# Patient Record
Sex: Male | Born: 2000 | Race: Black or African American | Hispanic: No | Marital: Single | Smoking: Never smoker
Health system: Southern US, Community
[De-identification: ages and names within clinical notes are randomized; demographics above are authoritative.]

## PROBLEM LIST (undated history)

## (undated) ENCOUNTER — Ambulatory Visit (HOSPITAL_COMMUNITY): Disposition: A | Discharge status: Home / Self Care | Payer: Self-pay

---
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
## 2019-08-25 ENCOUNTER — Other Ambulatory Visit (HOSPITAL_COMMUNITY): Payer: Self-pay | Admitting: Family Medicine

## 2020-04-21 IMAGING — US US SCROTUM W/ DOPPLER COMPLETE
1 series · 13 of 25 positions shown · non-contrast
Comparison: None.

CLINICAL DATA: Left scrotal region pain

EXAM:
SCROTAL ULTRASOUND
DOPPLER ULTRASOUND OF THE TESTICLES
TECHNIQUE: Complete ultrasound examination of the testicles, epididymis, and
other scrotal structures was performed. Color and spectral Doppler
ultrasound were also utilized to evaluate blood flow to the
testicles.

[Series 1: us scrotum w/ doppler complete · 0.07mm/px · 13 of 77 slices shown]
[im 1/77]
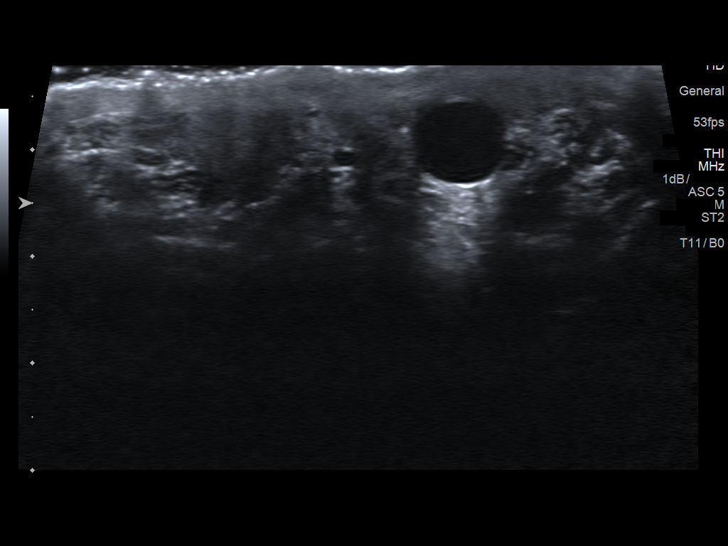
[im 7/77]
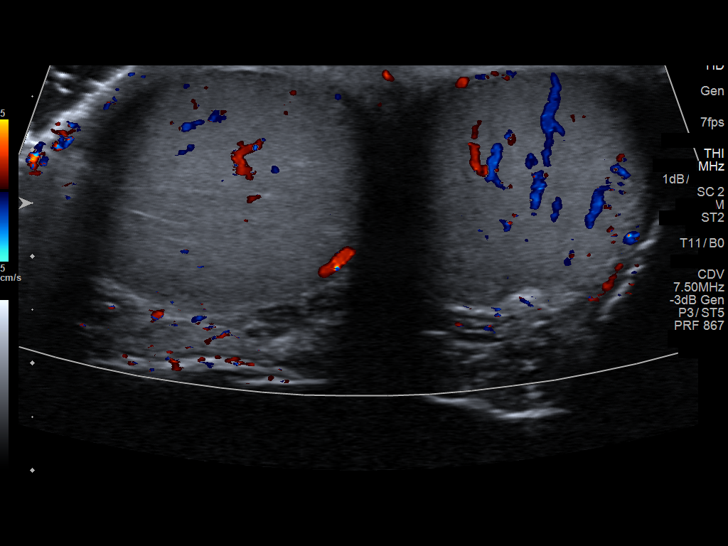
[im 13/77]
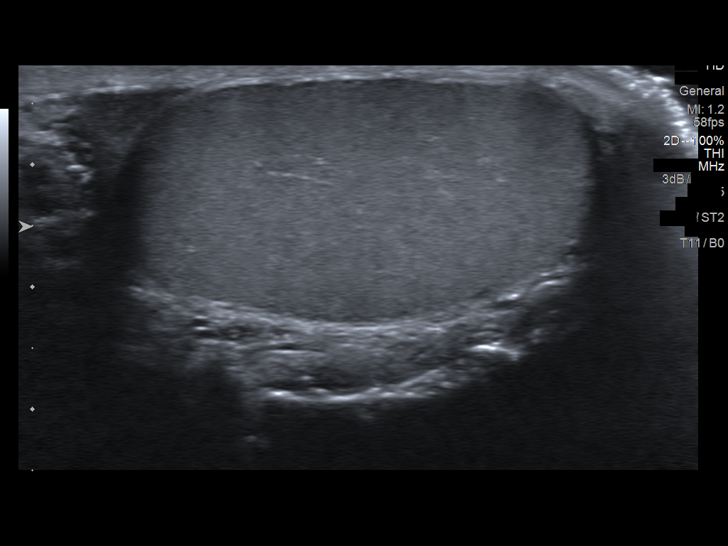
[im 20/77]
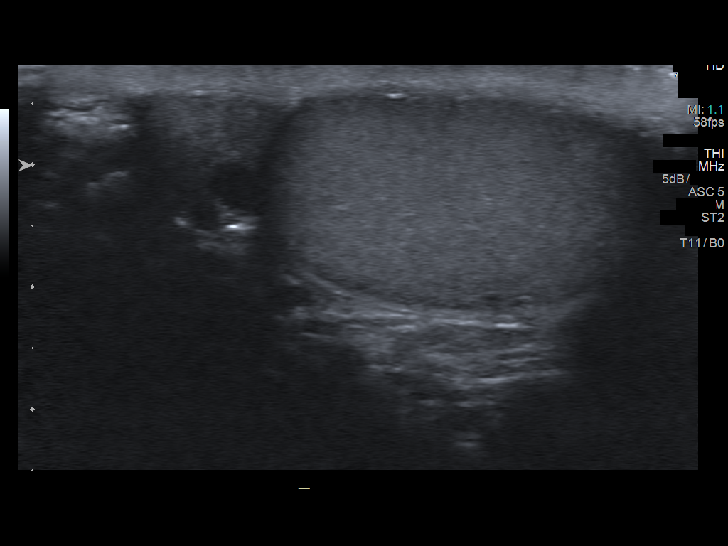
[im 26/77]
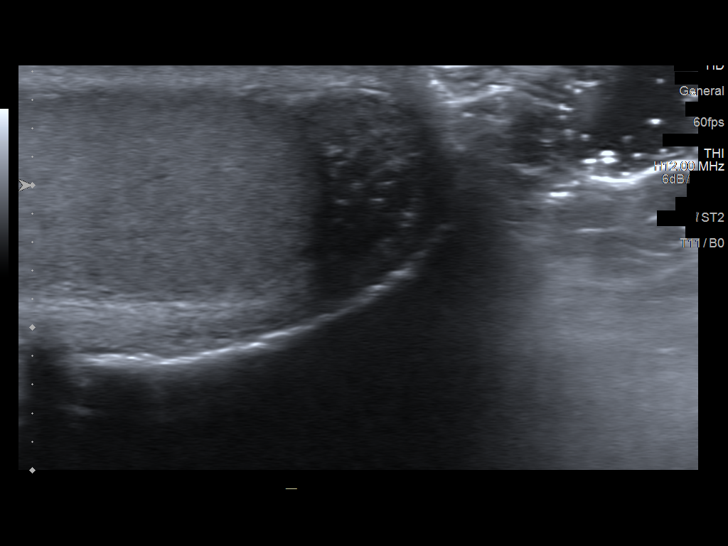
[im 32/77]
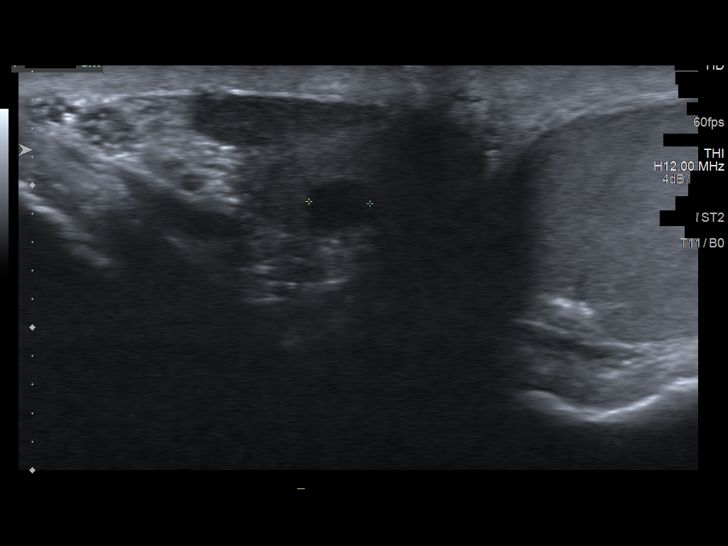
[im 39/77]
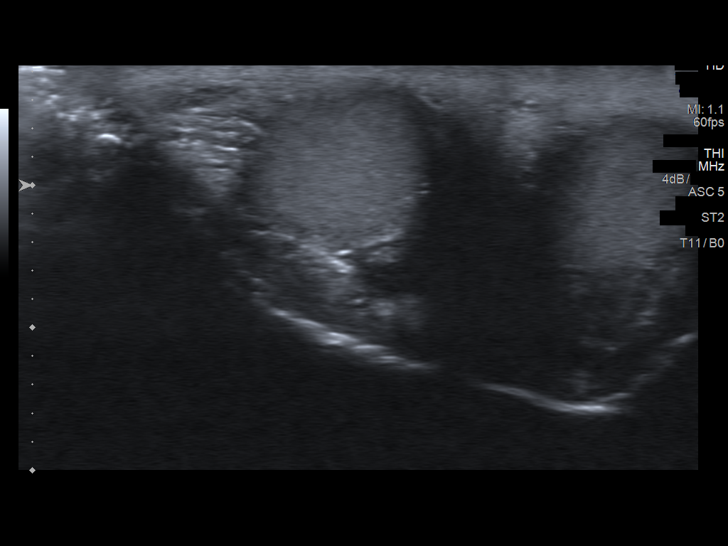
[im 45/77]
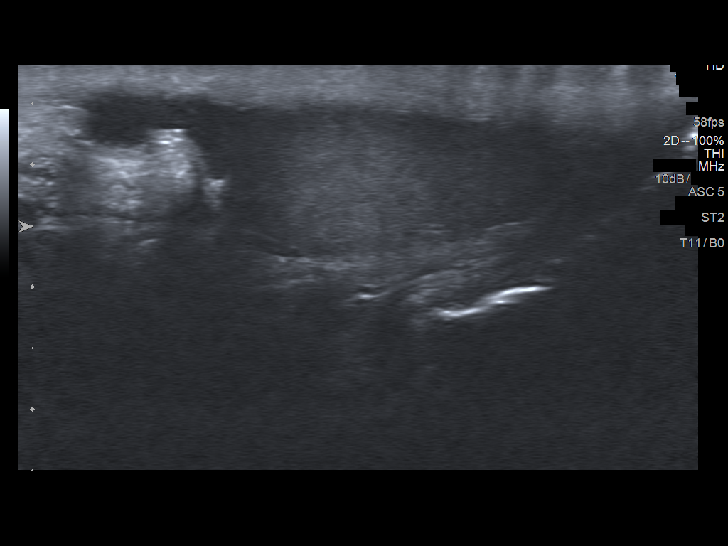
[im 51/77]
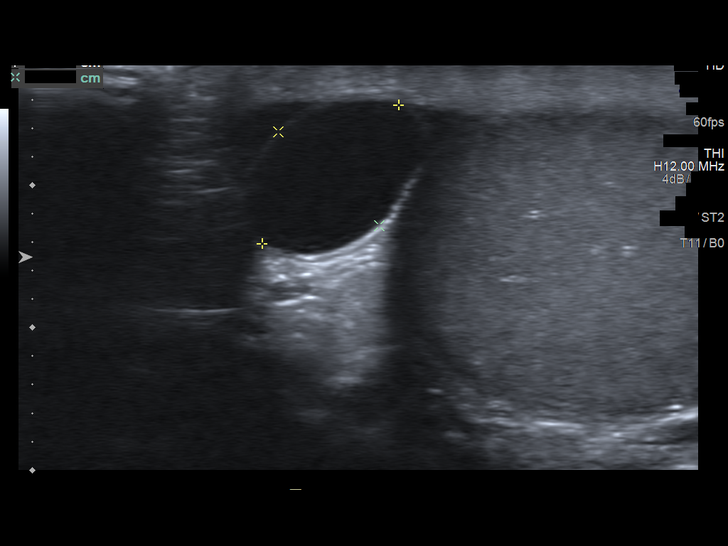
[im 58/77]
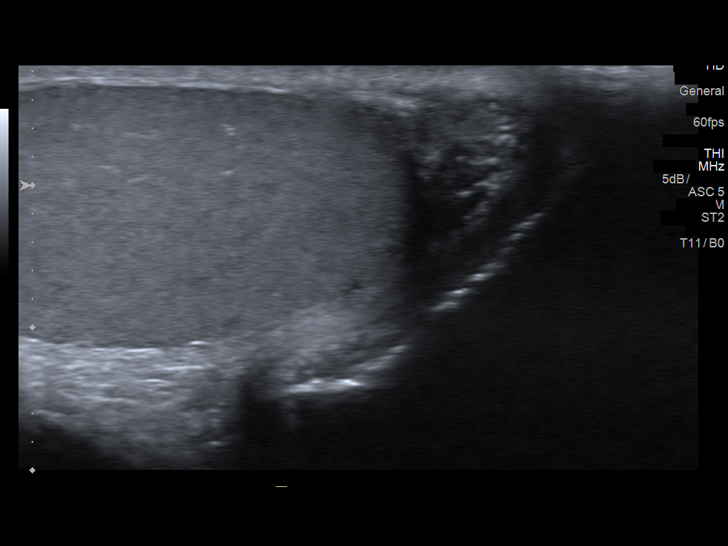
[im 64/77]
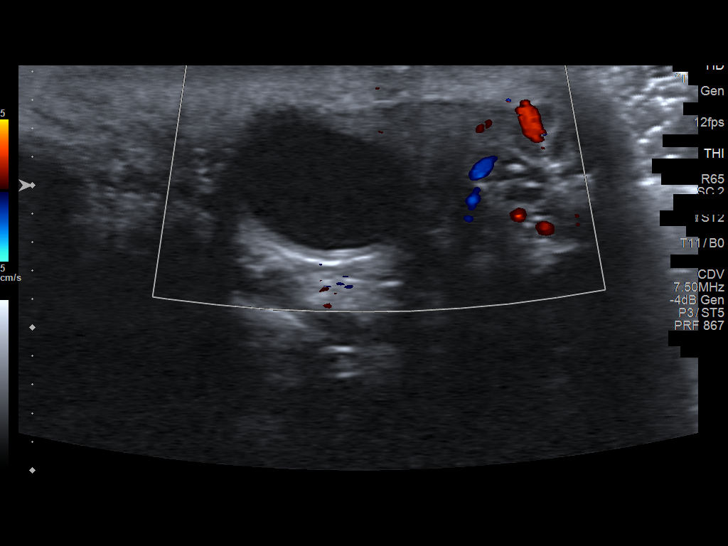
[im 70/77]
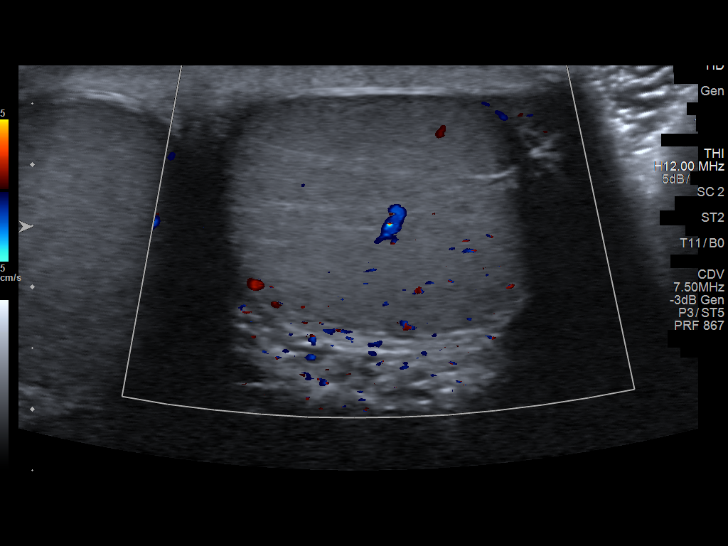
[im 77/77]
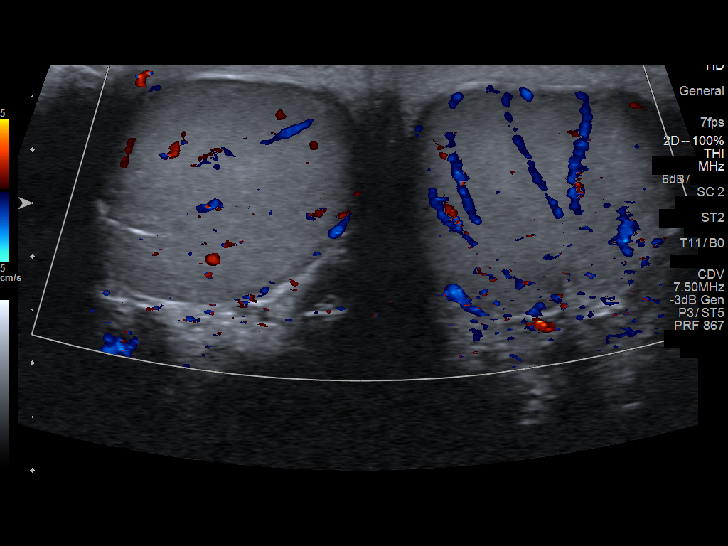

[13 of 25 positions shown; findings below may reference images not displayed]

FINDINGS: Right testicle

Measurements: 4.0 x 2.0 x 2.6 cm. No mass or microlithiasis
visualized.

Left testicle

Measurements: 4.1 x 1.9 x 2.6 cm. No mass or microlithiasis
visualized.

Right epididymis: There is a 4 mm cyst arising from the head of the
epididymis on the right. No inflammatory focus or hypervascularity
in the right epididymal region.

Left epididymis: There is a cyst arising from the head of the
epididymis on the left measuring 1.4 x 1.0 x 2.0 cm. No
hypervascularity or inflammatory focus evident.

Hydrocele: There is a minimal hydrocele on the left. There is no
appreciable hydrocele on the right.

Varicocele:  None visualized.

Pulsed Doppler interrogation of both testes demonstrates normal low
resistance arterial and venous waveforms bilaterally.

No evidence scrotal wall thickening or scrotal abscess.
IMPRESSION: 1. Epididymal head cysts, significantly larger on the left than on
the right. Epididymal head cyst on the left measures 1.4 x 1.0 x
cm.

2.  Minimal left hydrocele.

3. No evidence of orchitis or epididymitis. No intratesticular mass
on either side. No evident testicular torsion on either side.

## 2020-04-22 IMAGING — US US SCROTUM W/ DOPPLER COMPLETE
1 series · 14 of 25 positions shown · non-contrast
Comparison: None.

CLINICAL DATA: Pain and mass associated with right testis.

EXAM:
SCROTAL ULTRASOUND
DOPPLER ULTRASOUND OF THE TESTICLES
TECHNIQUE: Complete ultrasound examination of the testicles, epididymis, and
other scrotal structures was performed. Color and spectral Doppler
ultrasound were also utilized to evaluate blood flow to the
testicles.

[Series 1: us scrotum w/ doppler complete · 14 of 64 slices shown]
[im 1/64]
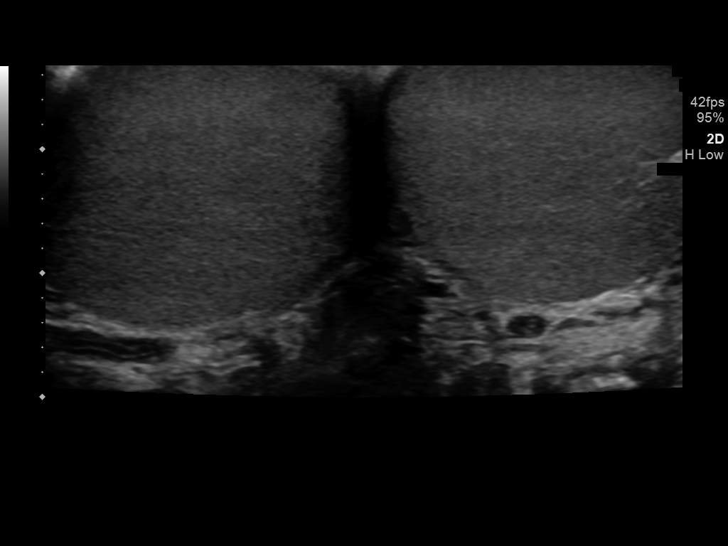
[im 6/64]
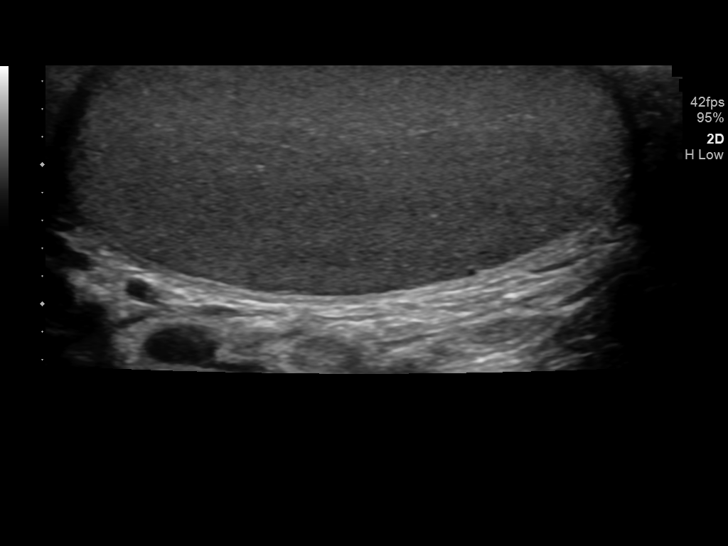
[im 11/64]
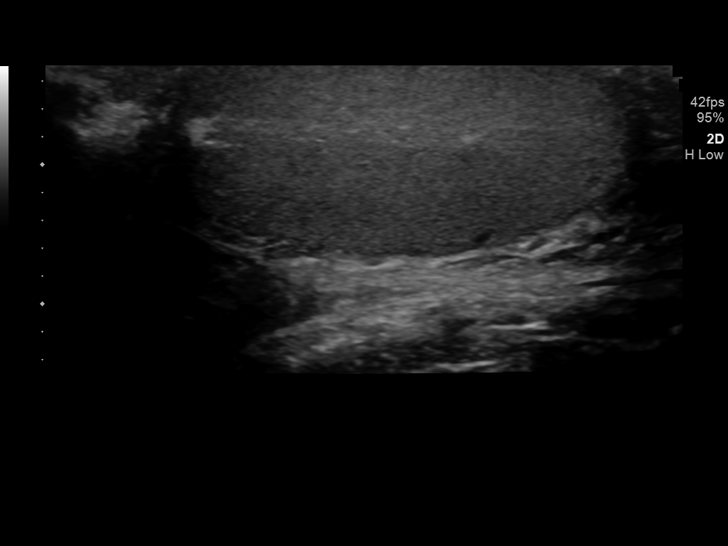
[im 16/64]
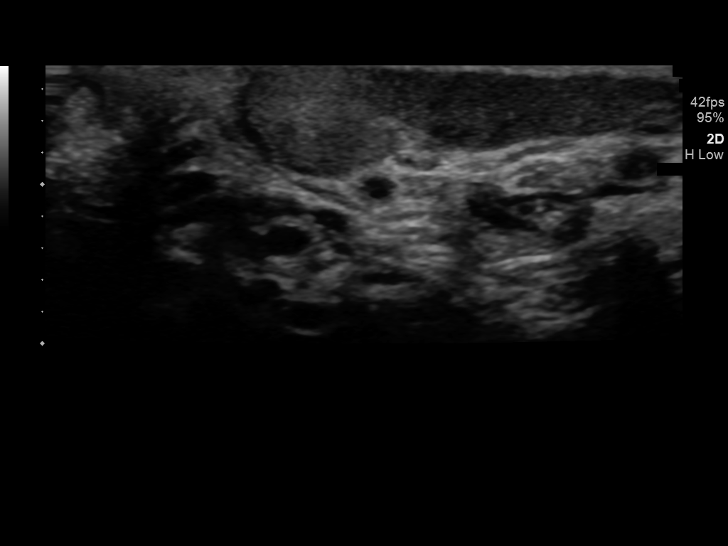
[im 22/64]
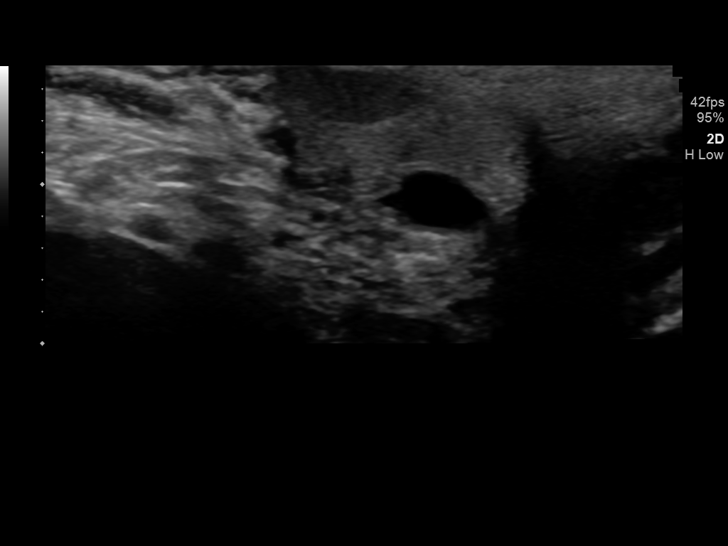
[im 24/64]
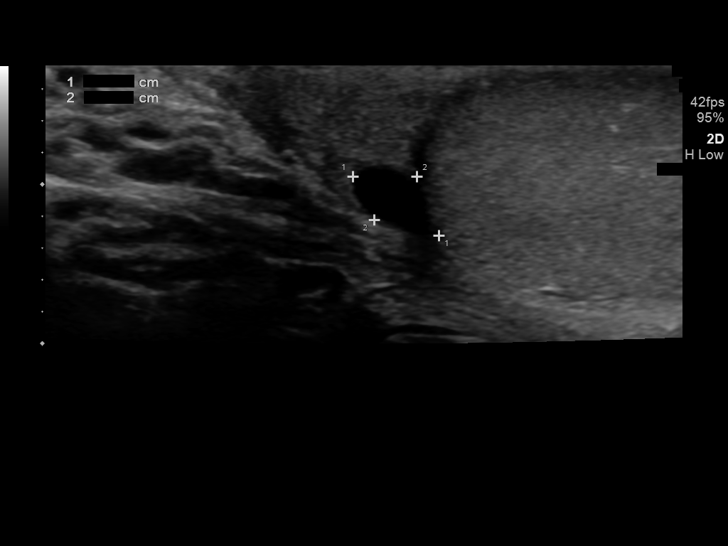
[im 29/64]
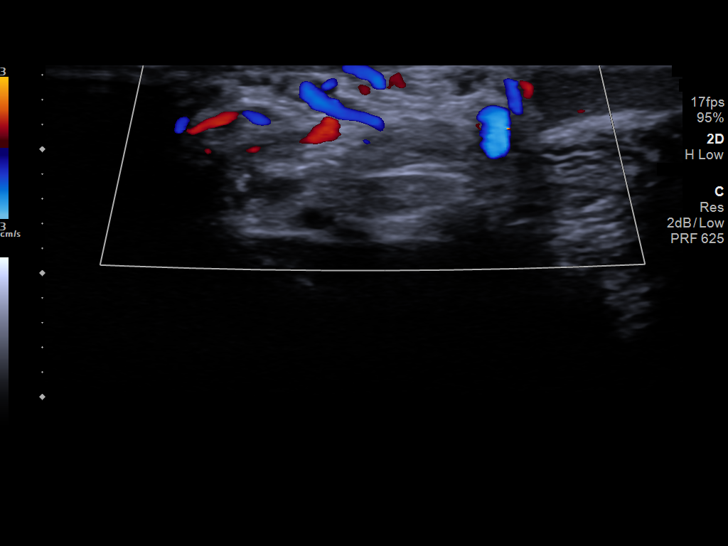
[im 35/64]
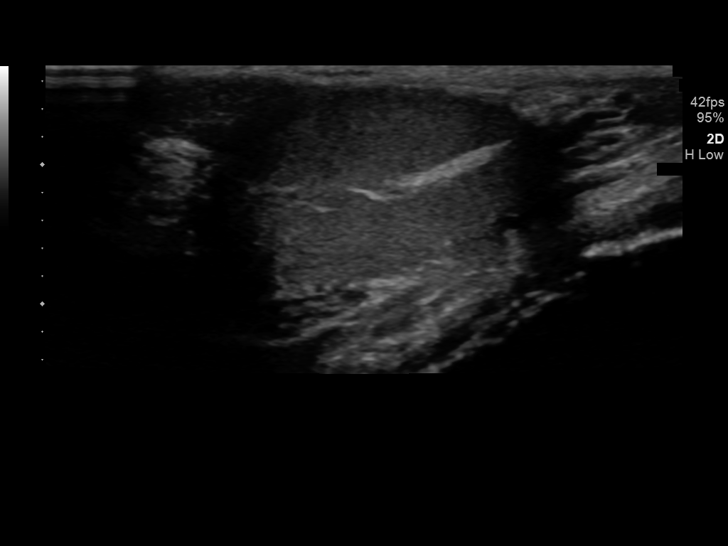
[im 40/64]
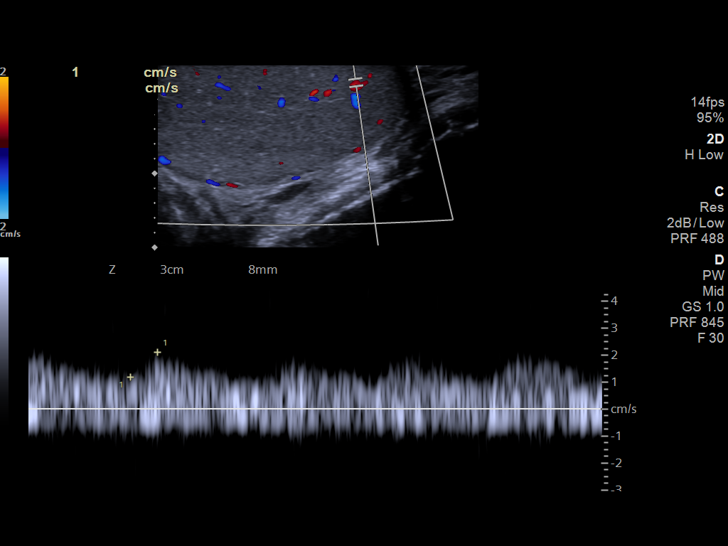
[im 43/64]
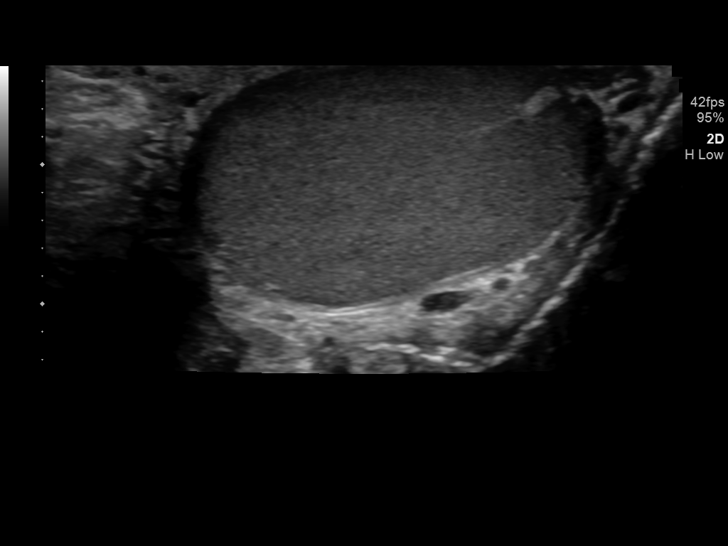
[im 48/64]
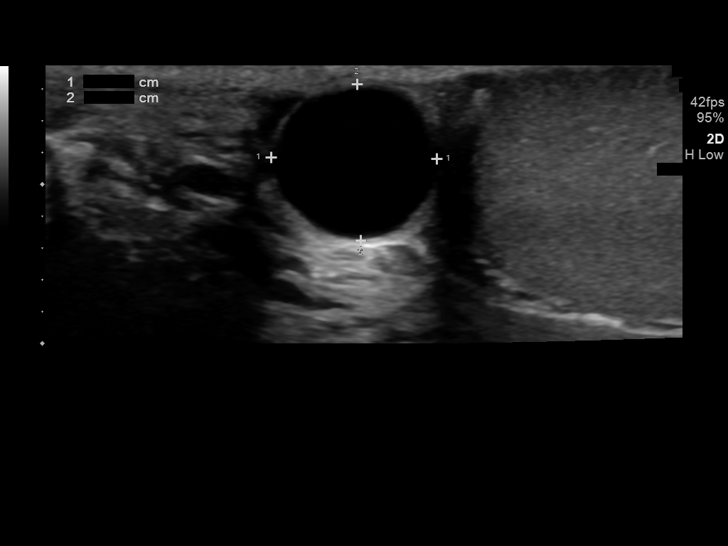
[im 53/64]
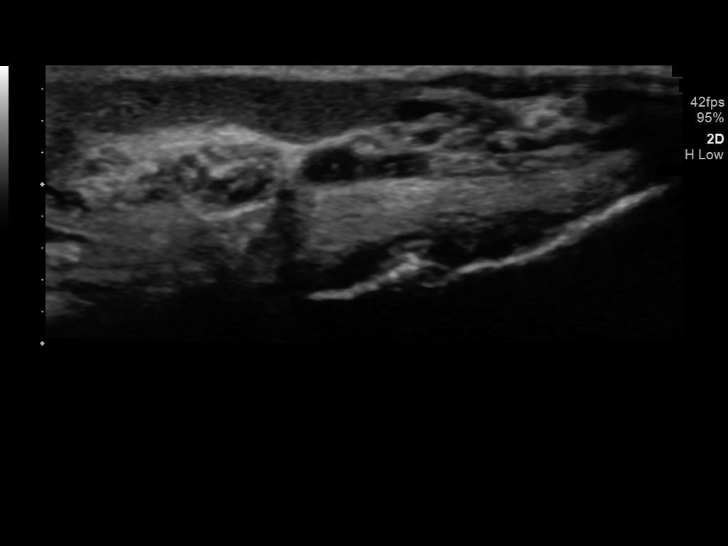
[im 58/64]
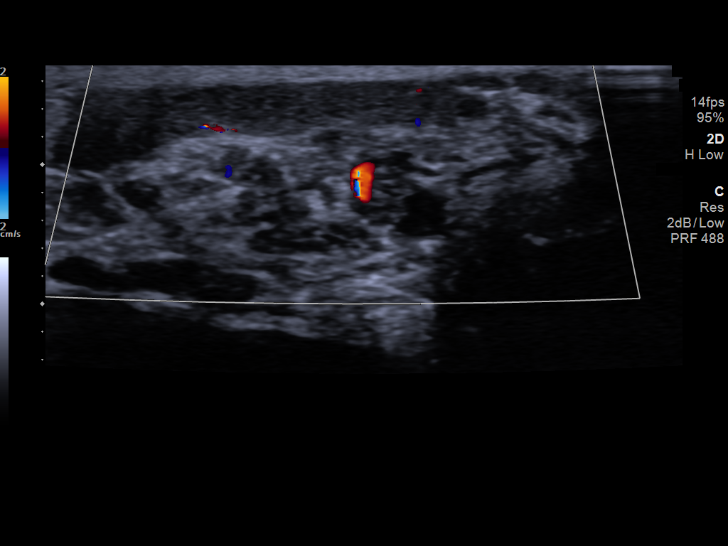
[im 64/64]
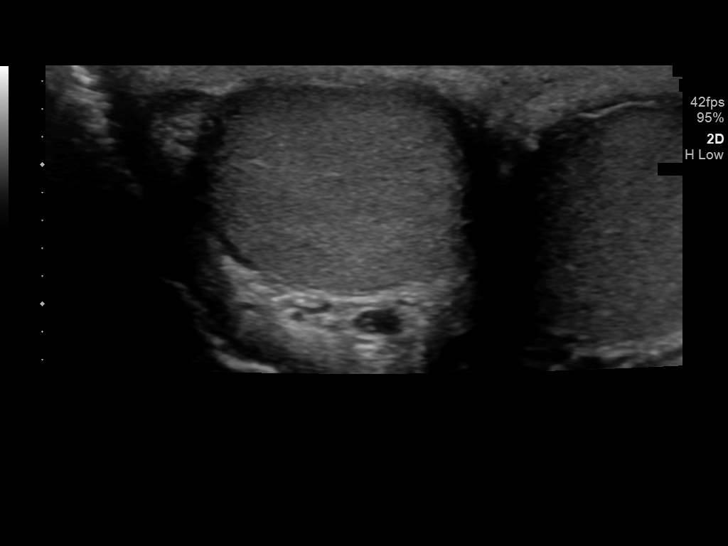

[14 of 25 positions shown; findings below may reference images not displayed]

FINDINGS: Right testicle

Measurements: 4.0 x 1.7 x 2.8 cm. No mass or microlithiasis
visualized.

Left testicle

Measurements: 3.8 x 2.0 x 2.7 cm. No mass or microlithiasis
visualized.

Right epididymis: 7 x 7 x 4 mm cyst identified within the
epididymis.

Left epididymis:  Cyst in left epididymis measures 10 x 10 x 10 mm.

Hydrocele:  None visualized.

Varicocele:  None visualized.

Pulsed Doppler interrogation of both testes demonstrates normal low
resistance arterial and venous waveforms bilaterally.
IMPRESSION: 1. No evidence for testicular mass or torsion.
2. Bilateral epididymal cysts measuring up to 1 cm.

## 2020-05-27 ENCOUNTER — Other Ambulatory Visit: Payer: Self-pay

## 2020-05-27 ENCOUNTER — Emergency Department (HOSPITAL_BASED_OUTPATIENT_CLINIC_OR_DEPARTMENT_OTHER)
Admission: EM | Admit: 2020-05-27 | Discharge: 2020-05-28 | Disposition: A | Discharge status: Home / Self Care | Payer: Self-pay | Attending: Emergency Medicine | Admitting: Emergency Medicine

## 2020-05-27 ENCOUNTER — Encounter (HOSPITAL_BASED_OUTPATIENT_CLINIC_OR_DEPARTMENT_OTHER): Payer: Self-pay | Admitting: *Deleted

## 2020-05-27 DIAGNOSIS — H6123 Impacted cerumen, bilateral: Secondary | ICD-10-CM | POA: Insufficient documentation

## 2020-05-27 DIAGNOSIS — K0889 Other specified disorders of teeth and supporting structures: Secondary | ICD-10-CM | POA: Insufficient documentation

## 2020-05-27 NOTE — ED Triage Notes (Signed)
Left ear pain and fullness for a week. The pain makes his teeth hurt.

## 2020-05-28 NOTE — ED Provider Notes (Signed)
[facility] EMERGENCY DEPARTMENT Provider Note   CSN: [id number] Arrival date & time: 05/27/20  2127   History Chief Complaint  Patient presents with  . Otalgia    Ronnie Glass is a 19 y.o. male.  The history is provided by the patient.  Otalgia He complains of his left ear being clogged for the last week.  That sensation has gotten worse and he is now having some sharp pains in that ear.  He denies any difficulty hearing.  He denies any fever or chills.  She has also noted some pain continues to be coming from a tooth on the left side.  He has not done anything to treat his symptoms.  History reviewed. No pertinent past medical history.  There are no problems to display for this patient.   History reviewed. No pertinent surgical history.     No family history on file.  Social History   Tobacco Use  . Smoking status: Never Smoker  . Smokeless tobacco: Never Used  Substance Use Topics  . Alcohol use: Not Currently  . Drug use: Yes    Types: Marijuana    Home Medications Prior to Admission medications   Medication Sig Start Date End Date Taking? Authorizing Provider  meloxicam (MOBIC) 7.5 MG tablet Take 7.5 mg by mouth daily.    [provider]    Allergies    Patient has no known allergies.  Review of Systems   Review of Systems  HENT: Positive for ear pain.   All other systems reviewed and are negative.   Physical Exam Updated Vital Signs BP 124/84 (BP Location: Right Arm)   Pulse 70   Temp 97.9 F (36.6 C) (Oral)   Resp 18   Ht 5\' 6"  (1.676 m)   Wt 63.4 kg   SpO2 100%   BMI 22.55 kg/m   Physical Exam Vitals and nursing note reviewed.   19 year old male, resting comfortably and in no acute distress. Vital signs are normal. Oxygen saturation is 100%, which is normal. Head is normocephalic and atraumatic. PERRLA, EOMI. Oropharynx is clear.  Both tympanic membranes are obscured by cerumen.  Dentition appears normal. Neck is  nontender and supple without adenopathy or JVD. Back is nontender and there is no CVA tenderness. Lungs are clear without rales, wheezes, or rhonchi. Chest is nontender. Heart has regular rate and rhythm without murmur. Abdomen is soft, flat, nontender without masses or hepatosplenomegaly and peristalsis is normoactive. Extremities have no cyanosis or edema, full range of motion is present. Skin is warm and dry without rash. Neurologic: Mental status is normal, cranial nerves are intact, there are no motor or sensory deficits.  ED Results / Procedures / Treatments   Labs ( Procedures Procedures   Medications Ordered in ED Medications - No data to display  ED Course  I have reviewed the triage vital signs and the nursing notes.  MDM Rules/Calculators/A&P Bilateral cerumen impactions.  Old records are reviewed, and he has no relevant past visits  Ears are irrigated and reevaluated.  After cerumen impaction is removed from left ear, patient feels much better.  The tympanic membrane shows no evidence of infection.  After cerumen impaction is removed from the right ear, the patient says he feels well.  He is advised use over-the-counter earwax control systems to prevent excessive earwax buildup in the future.  Final Clinical Impression(s) / ED Diagnoses Final diagnoses:  Bilateral impacted cerumen    Rx / DC Orders ED Discharge  Orders    None       Dione Booze, MD 05/28/20 9379

## 2020-05-28 NOTE — Discharge Instructions (Addendum)
Use over-the-counter ear wax control systems. Return if you are having any problems.

## 2020-06-12 IMAGING — US ULTRASOUND SCROTUM DOPPLER COMPLETE
1 series · 14 of 25 positions shown · non-contrast
Comparison: 07/08/2018

CLINICAL DATA: Testicular pain

EXAM:
SCROTAL ULTRASOUND
DOPPLER ULTRASOUND OF THE TESTICLES
TECHNIQUE: Complete ultrasound examination of the testicles, epididymis, and
other scrotal structures was performed. Color and spectral Doppler
ultrasound were also utilized to evaluate blood flow to the
testicles.

[Series 1: ultrasound scrotum doppler complete · 14 of 37 slices shown]
[im 1/37]
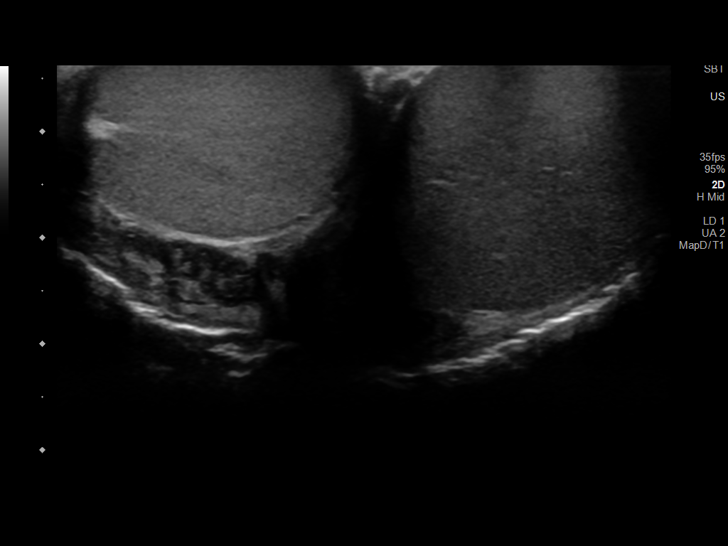
[im 4/37]
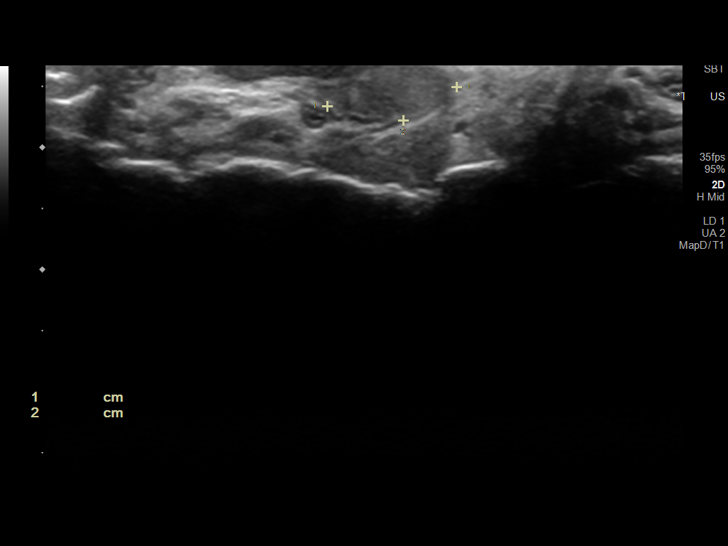
[im 7/37]
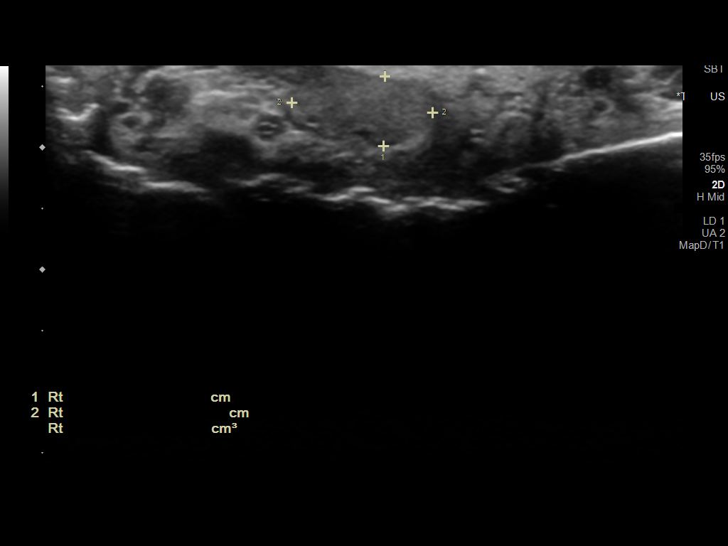
[im 10/37]
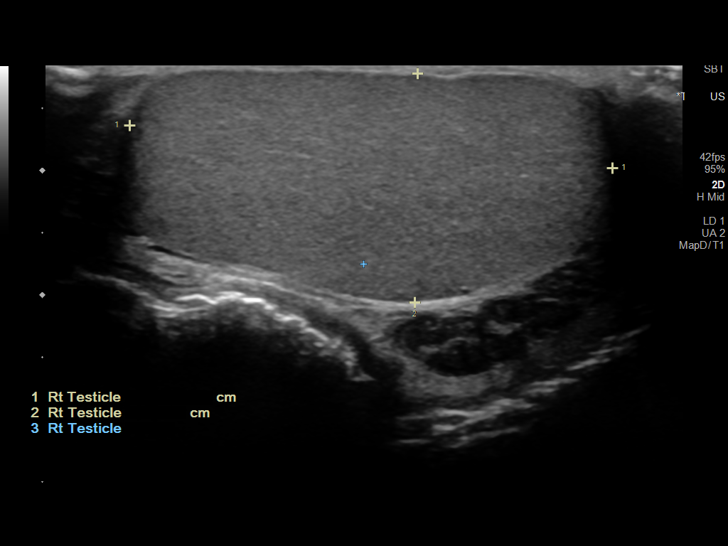
[im 13/37]
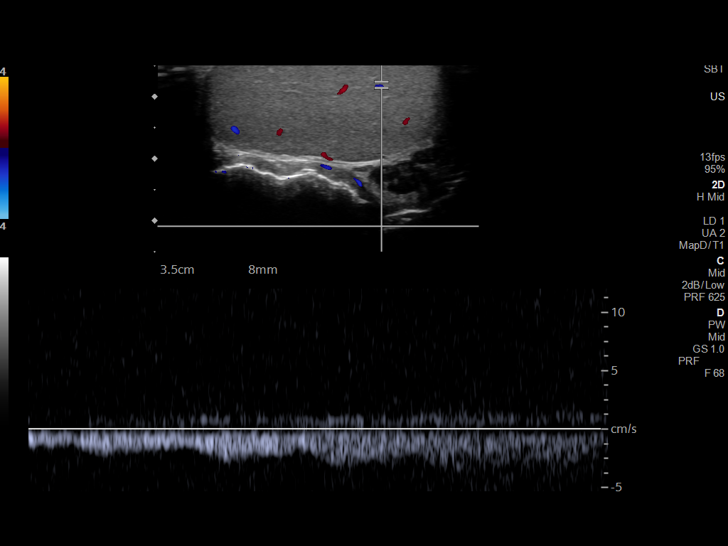
[im 14/37]
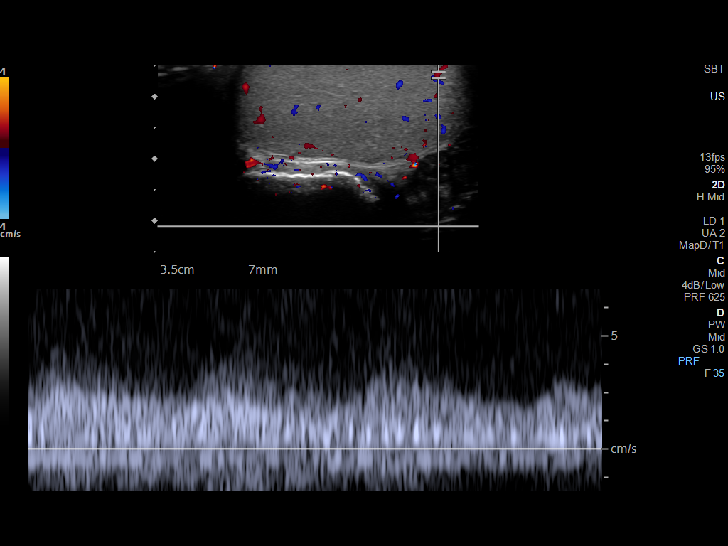
[im 17/37]
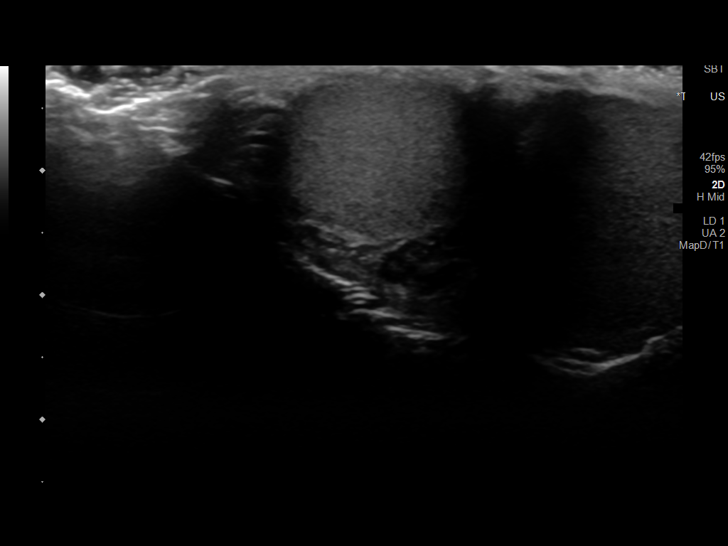
[im 20/37]
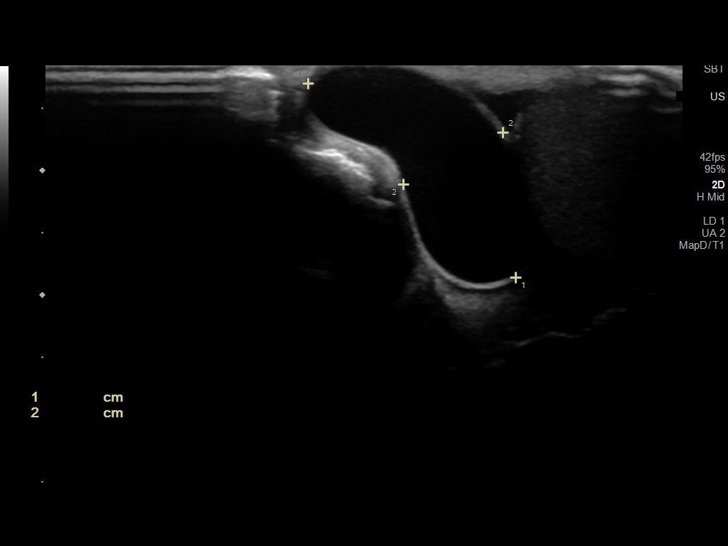
[im 23/37]
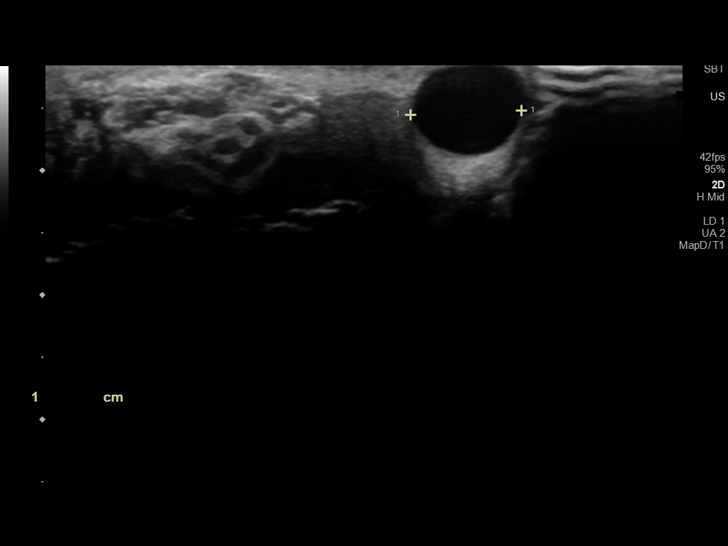
[im 25/37]
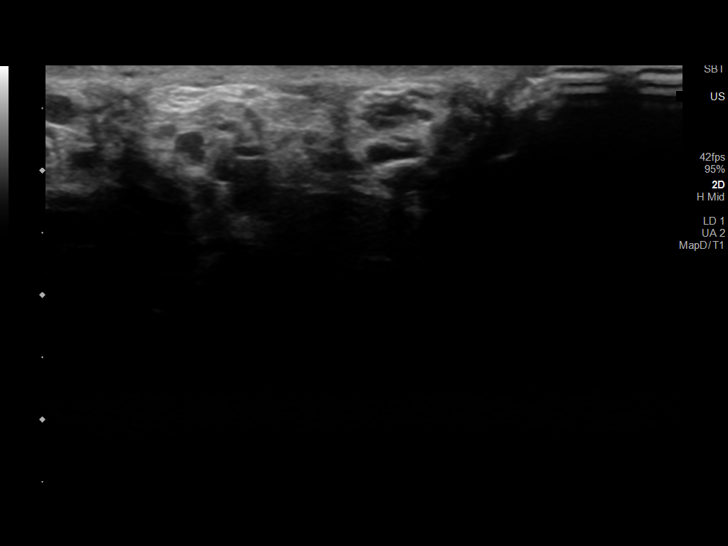
[im 28/37]
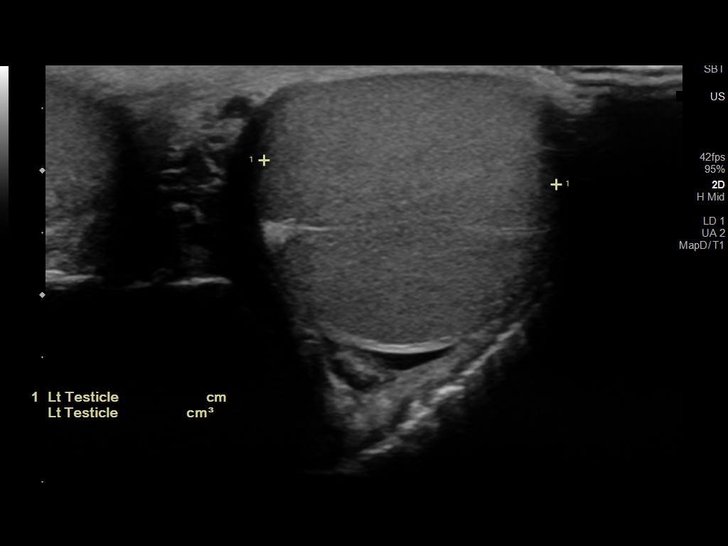
[im 31/37]
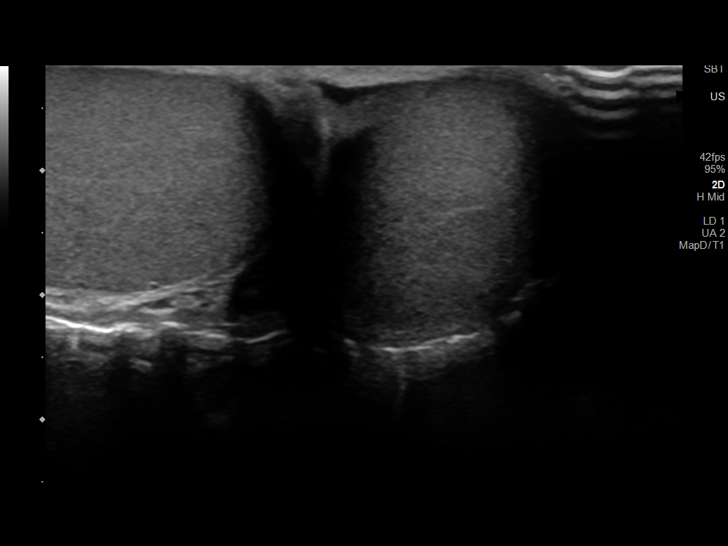
[im 34/37]
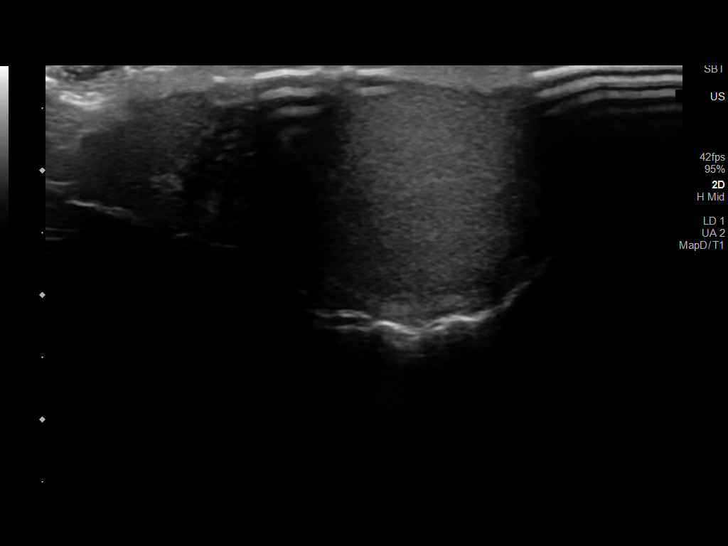
[im 37/37]
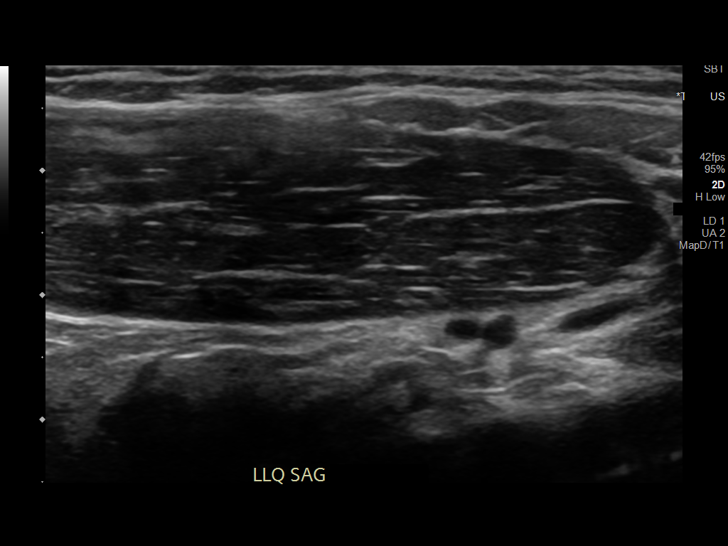

[14 of 25 positions shown; findings below may reference images not displayed]

FINDINGS: Right testicle

Measurements: 3.9 x 1.8 x 2.5 cm. No mass or microlithiasis
visualized.

Left testicle

Measurements: 3.6 x 1.9 x 2.4 cm. No mass or microlithiasis
visualized.

Right epididymis: Normal in size and appearance. 6 mm right
epididymal cyst.

Left epididymis: Normal in size and appearance. 2.3 cm left
epididymal cyst.

Hydrocele:  Small left hydrocele.

Varicocele:  None visualized.

Pulsed Doppler interrogation of both testes demonstrates normal low
resistance arterial and venous waveforms bilaterally.
IMPRESSION: 1. No testicular torsion.
2. Small left hydrocele.

## 2022-12-15 ENCOUNTER — Encounter (HOSPITAL_BASED_OUTPATIENT_CLINIC_OR_DEPARTMENT_OTHER): Payer: Self-pay

## 2022-12-15 ENCOUNTER — Emergency Department (HOSPITAL_BASED_OUTPATIENT_CLINIC_OR_DEPARTMENT_OTHER)
Admission: EM | Admit: 2022-12-15 | Discharge: 2022-12-15 | Disposition: A | Discharge status: Home / Self Care | Payer: Self-pay | Attending: Emergency Medicine | Admitting: Emergency Medicine

## 2022-12-15 DIAGNOSIS — J029 Acute pharyngitis, unspecified: Secondary | ICD-10-CM | POA: Insufficient documentation

## 2022-12-15 DIAGNOSIS — Z1152 Encounter for screening for COVID-19: Secondary | ICD-10-CM | POA: Insufficient documentation

## 2022-12-15 DIAGNOSIS — H6123 Impacted cerumen, bilateral: Secondary | ICD-10-CM | POA: Insufficient documentation

## 2022-12-15 LAB — SARS CORONAVIRUS 2 BY RT PCR: SARS Coronavirus 2 by RT PCR: NEGATIVE

## 2022-12-15 LAB — GROUP A STREP BY PCR: Group A Strep by PCR: NOT DETECTED

## 2022-12-15 NOTE — ED Triage Notes (Signed)
Patient has a sore throat for three days. He is also concerned his ears are clogged up because his hearing is not normal.

## 2022-12-15 NOTE — Discharge Instructions (Addendum)
The earwax in your ears has been cleaned out today.  Refrain from using Q-tips in your ears, but you may use them to clean the outside of your ears.   Your strep test was negative today. You may continue to drink warm tea with honey and use over the counter sore throat medications as needed to help with your sore throat. Your sore throat should improve on its own within the next week.   Return to the ER if you have uncontrolled fever, chills, difficulty breathing, difficulty swallowing, dizziness, any other symptoms that are concerning for you.

## 2022-12-15 NOTE — ED Provider Notes (Signed)
[facility] [facility] Provider Note   CSN: [id number] Arrival date & time: 12/15/22  1444     History  Chief Complaint  Patient presents with   Hearing Problem   Sore Throat    Ronnie Glass is a 22 y.o. male who presents with 3 days of sore throat when swallowing and  clogged ears.  Reports that his sore throat is only when swallowing and it has been gradually improving over the last couple days.  Denies any fevers or chills, cough, itchy eyes.  He does note a runny nose.  Denies any sick contacts.  He reports his ears have felt clogged for the past couple days.  He has tried hydrogen peroxide without improvement.  Uses Q-tips only on the outside of his ears.  Does use air pods which may have contributed to the clogging.   Sore Throat       Home Medications Prior to Admission medications   Not on File      Allergies    Patient has no known allergies.    Review of Systems   Review of Systems  Constitutional:  Negative for chills and fatigue.  HENT:  Positive for sore throat.     Physical Exam Updated Vital Signs BP (!) 143/85 (BP Location: Right Arm)   Pulse 87   Temp 98.5 F (36.9 C) (Oral)   Resp 16   Ht 5\' 6"  (1.676 m)   Wt 63 kg   SpO2 100%   BMI 22.42 kg/m  Physical Exam Vitals and nursing note reviewed.  Constitutional:      General: He is not in acute distress.    Appearance: He is well-developed.  HENT:     Right Ear: Tympanic membrane and ear canal normal.     Left Ear: Tympanic membrane and ear canal normal.     Ears:     Comments: Cerumen impaction bilaterally, removed with irrigation. TM's visualized bilaterally after irrigation    Nose: No rhinorrhea.     Mouth/Throat:     Pharynx: Oropharynx is clear. Uvula midline. No pharyngeal swelling, oropharyngeal exudate or posterior oropharyngeal erythema.     Tonsils: No tonsillar exudate.  Eyes:     Conjunctiva/sclera: Conjunctivae normal.  Cardiovascular:      Rate and Rhythm: Normal rate and regular rhythm.     Heart sounds: Normal heart sounds.  Pulmonary:     Effort: Pulmonary effort is normal. No respiratory distress.     Breath sounds: Normal breath sounds.  Abdominal:     Palpations: Abdomen is soft.     Tenderness: There is no abdominal tenderness.  Lymphadenopathy:     Cervical: No cervical adenopathy.  Skin:    General: Skin is warm and dry.  Neurological:     General: No focal deficit present.     Mental Status: He is alert.  Psychiatric:        Mood and Affect: Mood normal.     ED Results / Procedures / Treatments   Labs (all labs ordered are listed, but only abnormal results are displayed) Labs Reviewed  SARS CORONAVIRUS 2 BY RT PCR  GROUP A STREP BY PCR    EKG None  Radiology No results found.  Procedures Procedures    Medications Ordered in ED Medications - No data to display  ED Course/ Medical Decision Making/ A&P  Medical Decision Making  22 y.o. male presents to the ED for concern of clogged ears bilaterally and sore throat for 3 days  Differential diagnosis includes but is not limited to cerumen impaction, viral pharyngitis, group a strep, covid, URI  ED Course:  Patient overall well-appearing and vital signs stable aside from mildly elevated blood pressure.  Had bilateral cerumen impaction upon initial evaluation, ears were irrigated by G[facility] Roch Paramedic and cerumen removed. Patient tolerated this well with no dizziness. TMs visualized bilaterally after irrigation, no bulging or erythema or perforations.  External auditory canal with no erythema or edema. Patient reports sore throat has been improving over the last couple days.  He has been using over-the-counter sore throat sprays and tea. Covid negative. Group A strep negative, no exudate, less concerned for Strep pharyngitis at this time. No tonsillar swelling/abscess, able to swallow. Likely a viral  pharyngitis vs irritation due to post nasal drip. Reassuring it has been improving. Will encourage to keep using OTC sore throat sprays as needed and tea with honey.    Impression: Bilateral cerumen impaction Pharyngitis  Disposition:  The patient was discharged home with instructions to refrain from using Q-tips in the ears.  May continue to use over-the-counter sore throat sprays and warm tea with honey for symptomatic management of his sore throat. Return precautions given.  Lab Tests: I Ordered, and personally interpreted labs.  The pertinent results include:   Negative group A strep Negative COVID  Imaging Studies ordered: Not indicated   Cardiac Monitoring: / EKG: Not indicated   Consultations Obtained: Not indicated   Co morbidities that complicate the patient evaluation  None  Social Determinants of Health:  Unknown              Final Clinical Impression(s) / ED Diagnoses Final diagnoses:  Pharyngitis, unspecified etiology  Bilateral impacted cerumen    Rx / DC Orders ED Discharge Orders     None         Arabella Merles, PA-C 12/15/22 1708    Elayne Snare K, DO 12/15/22 2342

## 2022-12-15 NOTE — ED Notes (Signed)
Discharge paperwork reviewed entirely with patient, including follow up care. Pain was under control. No prescriptions were called in, but all questions were addressed.  Pt verbalized understanding as well as all parties involved. No questions or concerns voiced at the time of discharge. No acute distress noted.   Pt ambulated out to PVA without incident or assistance.
